# Patient Record
Sex: Female | Born: 1990 | Hispanic: Yes | Marital: Single | Smoking: Never smoker
Health system: Southern US, Community
[De-identification: ages and names within clinical notes are randomized; demographics above are authoritative.]

## PROBLEM LIST (undated history)

## (undated) DIAGNOSIS — Z789 Other specified health status: Secondary | ICD-10-CM

---

## 2017-11-14 ENCOUNTER — Other Ambulatory Visit: Payer: Self-pay | Admitting: Advanced Practice Midwife

## 2017-11-14 DIAGNOSIS — Z369 Encounter for antenatal screening, unspecified: Secondary | ICD-10-CM

## 2017-12-09 NOTE — L&D Delivery Note (Addendum)
Date of delivery: 06/12/18 Estimated Date of Delivery: 06/19/18 Patient's last menstrual period was 09/12/2017. EGA: [id number]  Delivery Note At 6:34 PM a viable female was delivered via Vaginal, Spontaneous (Presentation: cephalic; ROA).  APGAR: 8, 9; weight pending .   Placenta status: spontaneous, intact.  Cord: 3vv  with the following complications: nuchal x1 .  Cord pH: not collected  Anesthesia:  none Episiotomy:  none Lacerations:  none Suture Repair: n/a Est. Blood Loss (mL):  405cc  Mom presented to L&D with labor. Progressed to complete, AROM'd for clear fluid. second stage: 5min. delivery of fetal head with restitution to ROT.   Anterior then posterior shoulders delivered without difficulty.  Baby placed on mom's chest, and attended to by peds.  Cord was then clamped and cut by FOB after a >60sec delay.  Placenta spontaneously delivered, intact.   IV pitocin given for hemorrhage prophylaxis. No lacerations.  We sang happy birthday to baby [place].  Mom to postpartum.  Baby to Couplet care / Skin to Skin.  Chelsea C Ward 06/12/2018, 6:49 PM

## 2017-12-15 ENCOUNTER — Ambulatory Visit
Admission: RE | Admit: 2017-12-15 | Discharge: 2017-12-15 | Disposition: A | Discharge status: Home / Self Care | Payer: Self-pay | Source: Ambulatory Visit | Attending: Maternal and Fetal Medicine | Admitting: Maternal and Fetal Medicine

## 2017-12-15 ENCOUNTER — Ambulatory Visit (HOSPITAL_BASED_OUTPATIENT_CLINIC_OR_DEPARTMENT_OTHER)
Admission: RE | Admit: 2017-12-15 | Discharge: 2017-12-15 | Disposition: A | Discharge status: Home / Self Care | Payer: Self-pay | Source: Ambulatory Visit | Attending: Maternal and Fetal Medicine | Admitting: Maternal and Fetal Medicine

## 2017-12-15 VITALS — BP 102/56 | HR 81 | Temp 98.0°F | Resp 16 | Ht 62.5 in | Wt 105.6 lb

## 2017-12-15 DIAGNOSIS — Z3A13 13 weeks gestation of pregnancy: Secondary | ICD-10-CM | POA: Insufficient documentation

## 2017-12-15 DIAGNOSIS — Z369 Encounter for antenatal screening, unspecified: Secondary | ICD-10-CM

## 2017-12-15 HISTORY — DX: Other specified health status: Z78.9

## 2017-12-15 NOTE — Progress Notes (Signed)
Federico FlakeNewton, Kimberly Niles Length of Consultation: 40 minutes   Ms. Daine GravelMerlo Sanchez  was referred to [facility] for genetic counseling to review prenatal screening and testing options.  This note summarizes the information we discussed.    We offered the following routine screening tests for this pregnancy:  First trimester screening, which includes nuchal translucency ultrasound screen and first trimester maternal serum marker screening.  The nuchal translucency has approximately an 80% detection rate for Down syndrome and can be positive for other chromosome abnormalities as well as congenital heart defects.  When combined with a maternal serum marker screening, the detection rate is up to 90% for Down syndrome and up to 97% for trisomy 18.     Maternal serum marker screening, a blood test that measures pregnancy proteins, can provide risk assessments for Down syndrome, trisomy 18, and open neural tube defects (spina bifida, anencephaly). Because it does not directly examine the fetus, it cannot positively diagnose or rule out these problems.  Targeted ultrasound uses high frequency sound waves to create an image of the developing fetus.  An ultrasound is often recommended as a routine means of evaluating the pregnancy.  It is also used to screen for fetal anatomy problems (for example, a heart defect) that might be suggestive of a chromosomal or other abnormality.   Should these screening tests indicate an increased concern, then the following additional testing options would be offered:  The chorionic villus sampling procedure is available for first trimester chromosome analysis.  This involves the withdrawal of a small amount of chorionic villi (tissue from the developing placenta).  Risk of pregnancy loss is estimated to be approximately 1 in 200 to 1 in 100 (0.5 to 1%).  There is approximately a 1% (1 in 100) chance that the CVS chromosome results will be unclear.   Chorionic villi cannot be tested for neural tube defects.     Amniocentesis involves the removal of a small amount of amniotic fluid from the sac surrounding the fetus with the use of a thin needle inserted through the maternal abdomen and uterus.  Ultrasound guidance is used throughout the procedure.  Fetal cells from amniotic fluid are directly evaluated and > 99.5% of chromosome problems and > 98% of open neural tube defects can be detected. This procedure is generally performed after the 15th week of pregnancy.  The main risks to this procedure include complications leading to miscarriage in less than 1 in 200 cases (0.5%).  As another option for information if the pregnancy is suspected to be an an increased chance for certain chromosome conditions, we also reviewed the availability of cell free fetal DNA testing from maternal blood to determine whether or not the baby may have either Down syndrome, trisomy 11013, or trisomy 8518.  This test utilizes a maternal blood sample and DNA sequencing technology to isolate circulating cell free fetal DNA from maternal plasma.  The fetal DNA can then be analyzed for DNA sequences that are derived from the three most common chromosomes involved in aneuploidy, chromosomes 13, 18, and 21.  If the overall amount of DNA is greater than the expected level for any of these chromosomes, aneuploidy is suspected.  While we do not consider it a replacement for invasive testing and karyotype analysis, a negative result from this testing would be reassuring, though not a guarantee of a normal chromosome complement for the baby.  An abnormal result is certainly suggestive of an abnormal chromosome complement, though we would  still recommend CVS or amniocentesis to confirm any findings from this testing.  Cystic Fibrosis and Spinal Muscular Atrophy (SMA) screening were also discussed with the patient. Both conditions are recessive, which means that both parents must be carriers in  order to have a child with the disease.  Cystic fibrosis (CF) is one of the most common genetic conditions in persons of Caucasian ancestry.  This condition occurs in approximately 1 in 2,500 Caucasian persons and results in thickened secretions in the lungs, digestive, and reproductive systems.  For a baby to be at risk for having CF, both of the parents must be carriers for this condition.  Approximately 1 in 86 Caucasian persons is a carrier for CF.  Current carrier testing looks for the most common mutations in the gene for CF and can detect approximately 90% of carriers in the Caucasian population.  This means that the carrier screening can greatly reduce, but cannot eliminate, the chance for an individual to have a child with CF.  If an individual is found to be a carrier for CF, then carrier testing would be available for the partner. As part of [place]'s newborn screening profile, all babies born in the state of [place] will have a two-tier screening process.  Specimens are first tested to determine the concentration of immunoreactive trypsinogen (IRT).  The top 5% of specimens with the highest IRT values then undergo DNA testing using a panel of over 40 common CF mutations. SMA is a neurodegenerative disorder that leads to atrophy of skeletal muscle and overall weakness.  This condition is also more prevalent in the Caucasian population, with 1 in 40-1 in 60 persons being a carrier and 1 in 6,000-1 in 10,000 children being affected.  There are multiple forms of the disease, with some causing death in infancy to other forms with survival into adulthood.  The genetics of SMA is complex, but carrier screening can detect up to 95% of carriers in the Caucasian population.  Similar to CF, a negative result can greatly reduce, but cannot eliminate, the chance to have a child with SMA.  We obtained a detailed family history and pregnancy history.  The father of the baby, Norva Pavlov, reported one niece  with a heart murmur at birth.  This resolved naturally and did not require any medical intervention, so we would not expect that history to increase the chance for a heart condition in other family members.  Ms. Tekeya Geffert stated that one of her brothers and one of her maternal aunts have seizures.  There is no known cause for the seizures in either of them and no reported findings suggestive of a neurocutaneous genetic syndrome.  It is estimated that 80% of seizures are idiopathic with only 20% having a known cause.  In the absence of a known genetic syndrome as the cause, we would expect a low recurrence risk in this pregnancy, as the affected individuals are second and third degree relatives to the pregnancy. The remainder of the family history was reported to be unremarkable for birth defects, intellectual delays, recurrent pregnancy loss or known chromosome abnormalities.  Ms. Milanna Kozlov stated that this is the second pregnancy for she and her husband.  She reported no complications or exposures that would be expected to increase the risk for birth defects in this pregnancy.  After consideration of the options, Ms. Agatha Duplechain elected to proceed with first trimester screening.  She declined CF and SMA carrier screening.  Hemoglobinopathy testing was performed  at [facility] and reported to be normal.  An ultrasound was performed at the time of the visit.  The gestational age was consistent with  13 weeks.  Fetal anatomy could not be assessed due to early gestational age.  Please refer to the ultrasound report for details of that study. She was scheduled to return for an anatomy ultrasound on 01/08/2018, prior to the end of her [place] [employer].  Ms. Kamarie Veno was encouraged to call with questions or concerns.  We can be contacted at [phone].  Labs drawn:  First trimester screening  Cherly Anderson, MS, CGC

## 2017-12-25 ENCOUNTER — Telehealth: Payer: Self-pay | Admitting: Obstetrics and Gynecology

## 2017-12-25 NOTE — Telephone Encounter (Signed)
  Ms. Natalie White elected to undergo First Trimester screening on 12/15/2017.  To review, first trimester screening, includes nuchal translucency ultrasound screen and/or first trimester maternal serum marker screening.  The nuchal translucency has approximately an 80% detection rate for Down syndrome and can be positive for other chromosome abnormalities as well as heart defects.  When combined with a maternal serum marker screening, the detection rate is up to 90% for Down syndrome and up to 97% for trisomy 13 and 18.     The results of the First Trimester Nuchal Translucency and Biochemical Screening were within normal range.  The risk for Down syndrome is now estimated to be less than 1 in 10,000.  The risk for Trisomy 13/18 is also estimated to be less than 1 in 10,000.  Should more definitive information be desired, we would offer amniocentesis.  Because we do not yet know the effectiveness of combined first and second trimester screening, we do not recommend a maternal serum screen to assess the chance for chromosome conditions.  However, if screening for neural tube defects is desired, maternal serum screening for AFP only can be performed between 15 and [redacted] weeks gestation.      Cherly Andersoneborah F. Colton Tassin, MS, CGC

## 2017-12-29 ENCOUNTER — Emergency Department: Payer: Self-pay

## 2017-12-29 ENCOUNTER — Other Ambulatory Visit: Payer: Self-pay

## 2017-12-29 ENCOUNTER — Encounter: Payer: Self-pay | Admitting: *Deleted

## 2017-12-29 ENCOUNTER — Emergency Department
Admission: EM | Admit: 2017-12-29 | Discharge: 2017-12-29 | Disposition: A | Discharge status: Home / Self Care | Payer: Self-pay | Attending: Student in an Organized Health Care Education/Training Program | Admitting: Student in an Organized Health Care Education/Training Program

## 2017-12-29 DIAGNOSIS — B9689 Other specified bacterial agents as the cause of diseases classified elsewhere: Secondary | ICD-10-CM | POA: Insufficient documentation

## 2017-12-29 DIAGNOSIS — N760 Acute vaginitis: Secondary | ICD-10-CM

## 2017-12-29 DIAGNOSIS — O4690 Antepartum hemorrhage, unspecified, unspecified trimester: Secondary | ICD-10-CM

## 2017-12-29 DIAGNOSIS — B373 Candidiasis of vulva and vagina: Secondary | ICD-10-CM | POA: Insufficient documentation

## 2017-12-29 DIAGNOSIS — O4692 Antepartum hemorrhage, unspecified, second trimester: Secondary | ICD-10-CM | POA: Insufficient documentation

## 2017-12-29 DIAGNOSIS — R1030 Lower abdominal pain, unspecified: Secondary | ICD-10-CM | POA: Insufficient documentation

## 2017-12-29 DIAGNOSIS — B3731 Acute candidiasis of vulva and vagina: Secondary | ICD-10-CM

## 2017-12-29 DIAGNOSIS — Z3A15 15 weeks gestation of pregnancy: Secondary | ICD-10-CM | POA: Insufficient documentation

## 2017-12-29 DIAGNOSIS — O368121 Decreased fetal movements, second trimester, fetus 1: Secondary | ICD-10-CM | POA: Insufficient documentation

## 2017-12-29 DIAGNOSIS — N939 Abnormal uterine and vaginal bleeding, unspecified: Secondary | ICD-10-CM

## 2017-12-29 DIAGNOSIS — O23592 Infection of other part of genital tract in pregnancy, second trimester: Secondary | ICD-10-CM | POA: Insufficient documentation

## 2017-12-29 LAB — CBC WITH DIFFERENTIAL/PLATELET
BASOS ABS: 0 10*3/uL (ref 0–0.1)
BASOS PCT: 0 %
Eosinophils Absolute: 0 10*3/uL (ref 0–0.7)
Eosinophils Relative: 1 %
HCT: 31.3 % — ABNORMAL LOW (ref 35.0–47.0)
Hemoglobin: 10.7 g/dL — ABNORMAL LOW (ref 12.0–16.0)
Lymphocytes Relative: 20 %
Lymphs Abs: 1.4 10*3/uL (ref 1.0–3.6)
MCH: 30.6 pg (ref 26.0–34.0)
MCHC: 34.2 g/dL (ref 32.0–36.0)
MCV: 89.5 fL (ref 80.0–100.0)
MONO ABS: 0.5 10*3/uL (ref 0.2–0.9)
Monocytes Relative: 7 %
NEUTROS ABS: 5.1 10*3/uL (ref 1.4–6.5)
Neutrophils Relative %: 72 %
PLATELETS: 166 10*3/uL (ref 150–440)
RBC: 3.5 MIL/uL — ABNORMAL LOW (ref 3.80–5.20)
RDW: 14.1 % (ref 11.5–14.5)
WBC: 7 10*3/uL (ref 3.6–11.0)

## 2017-12-29 LAB — CHLAMYDIA/NGC RT PCR (ARMC ONLY)
CHLAMYDIA TR: NOT DETECTED
N GONORRHOEAE: NOT DETECTED

## 2017-12-29 LAB — URINALYSIS, COMPLETE (UACMP) WITH MICROSCOPIC
BACTERIA UA: NONE SEEN
BILIRUBIN URINE: NEGATIVE
Glucose, UA: NEGATIVE mg/dL
Hgb urine dipstick: NEGATIVE
KETONES UR: NEGATIVE mg/dL
Nitrite: NEGATIVE
PH: 6 (ref 5.0–8.0)
Protein, ur: NEGATIVE mg/dL
Specific Gravity, Urine: 1.024 (ref 1.005–1.030)

## 2017-12-29 LAB — HCG, QUANTITATIVE, PREGNANCY: HCG, BETA CHAIN, QUANT, S: 47975 m[IU]/mL — AB (ref <5)

## 2017-12-29 LAB — COMPREHENSIVE METABOLIC PANEL
ALT: 11 U/L — ABNORMAL LOW (ref 14–54)
AST: 16 U/L (ref 15–41)
Albumin: 3.6 g/dL (ref 3.5–5.0)
Alkaline Phosphatase: 55 U/L (ref 38–126)
Anion gap: 8 (ref 5–15)
BUN: 10 mg/dL (ref 6–20)
CHLORIDE: 105 mmol/L (ref 101–111)
CO2: 21 mmol/L — AB (ref 22–32)
Calcium: 8.8 mg/dL — ABNORMAL LOW (ref 8.9–10.3)
Creatinine, Ser: 0.53 mg/dL (ref 0.44–1.00)
GFR calc Af Amer: >60 mL/min (ref >60)
GFR calc non Af Amer: >60 mL/min (ref >60)
Glucose, Bld: 82 mg/dL (ref 65–99)
POTASSIUM: 4.1 mmol/L (ref 3.5–5.1)
Sodium: 134 mmol/L — ABNORMAL LOW (ref 135–145)
Total Bilirubin: 0.9 mg/dL (ref 0.3–1.2)
Total Protein: 7.1 g/dL (ref 6.5–8.1)

## 2017-12-29 LAB — WET PREP, GENITAL
Sperm: NONE SEEN
Trich, Wet Prep: NONE SEEN

## 2017-12-29 LAB — POCT PREGNANCY, URINE: Preg Test, Ur: POSITIVE — AB

## 2017-12-29 MED ORDER — METRONIDAZOLE 500 MG PO TABS: 500.0000 mg | ORAL_TABLET | Freq: Two times a day (BID) | ORAL | 0 refills | Status: AC

## 2017-12-29 MED ORDER — MICONAZOLE NITRATE 2 % VA CREA: 1.0000 | TOPICAL_CREAM | Freq: Every day | VAGINAL | 0 refills | Status: DC

## 2017-12-29 NOTE — ED Notes (Signed)

## 2017-12-29 NOTE — ED Triage Notes (Signed)
Pt has vaginal bleeding that began today.  Pt also reports low abd pain.  No urinary sx.  Pt has lower back pain.  Pt alert.

## 2017-12-29 NOTE — ED Provider Notes (Addendum)
[facility] Emergency Department Provider Note ____________________________________________  Time seen: 1739  I have reviewed the triage vital signs and the nursing notes.  HISTORY  Chief Complaint  Vaginal Bleeding  HPI Natalie White is a 27 y.o. female, G2P1 presents to the ED at [redacted] weeks GA, for evaluation of vaginal bleeding that began today. She reports LMP of 09/12/17 with EDD of 06/22/18. Patient notes some lower abdominal pain, but  denies any dysuria, hematuria, or frequency.  Scribes a single episode of scant, dark blood with wiping, this morning.  She denies any active ongoing vaginal bleeding, passage of bright red blood, or blood clots.  She also notes some decreased fetal movement in the last couple days.  She denies any nausea, vomiting, or dizziness.  She describes typically feeling the baby move a little higher in her pelvis.  She is under the care of [facility] department for routine prenatal care.  She has a visit scheduled for January 31 for routine ultrasound.  Past Medical History:  Diagnosis Date  . Medical history non-contributory     Patient Active Problem List   Diagnosis Date Noted  . First trimester screening     No past surgical history on file.  Prior to Admission medications   Medication Sig Start Date End Date Taking? Authorizing Provider  Prenatal Vit-Fe Fumarate-FA (PRENATAL MULTIVITAMIN) TABS tablet Take 1 tablet by mouth daily at 12 noon.    [provider]   Allergies Patient has no known allergies.  Family History  Problem Relation Age of Onset  . Hypertension Mother     Social History Social History   Tobacco Use  . Smoking status: Never Smoker  . Smokeless tobacco: Never Used  Substance Use Topics  . Alcohol use: No    Frequency: Never  . Drug use: No    Review of Systems  Constitutional: Negative for fever. Cardiovascular: Negative for chest pain. Respiratory: Negative for  shortness of breath. Gastrointestinal: Negative for abdominal pain, vomiting and diarrhea. Genitourinary: Negative for dysuria.  Musculoskeletal: Negative for back pain. Skin: Negative for rash. Neurological: Negative for headaches, focal weakness or numbness. ____________________________________________  PHYSICAL EXAM:  VITAL SIGNS: ED Triage Vitals [12/29/17 1601]  Enc Vitals Group     BP 90/60     Pulse Rate 90     Resp 18     Temp 99.1 F (37.3 C)     Temp Source Oral     SpO2 99 %     Weight 105 lb (47.6 kg)     Height 5\' 2"  (1.575 m)     Head Circumference      Peak Flow      Pain Score 7     Pain Loc      Pain Edu?      Excl. in GC?     Constitutional: Alert and oriented. Well appearing and in no distress. Head: Normocephalic and atraumatic. Mouth/Throat: Mucous membranes are moist. Neck: Supple. No thyromegaly. Hematological/Lymphatic/Immunological: No cervical lymphadenopathy. Cardiovascular: Normal rate, regular rhythm. Normal distal pulses. Respiratory: Normal respiratory effort. No wheezes/rales/rhonchi. Gastrointestinal: Soft and nontender. No distention, rebound, guarding, or rigidity. Normal bowel sounds GU: Normal external genitalia. Closed cervical os with moderate thin, white, discharge in the vagina. Fundus palpable between the umbilicus and pubic symphysis. No CMT or adnexal masses noted.  Musculoskeletal: Nontender with normal range of motion in all extremities.  Neurologic:  Normal gait without ataxia. Normal speech and language. No gross focal  neurologic deficits are appreciated. Skin:  Skin is warm, dry and intact. No rash noted. Psychiatric: Mood and affect are normal. Patient exhibits appropriate insight and judgment. ____________________________________________   LABS (pertinent positives/negatives)  Labs Reviewed  WET PREP, GENITAL - Abnormal; Notable for the following components:      Result Value   Yeast Wet Prep HPF POC PRESENT (*)     Clue Cells Wet Prep HPF POC PRESENT (*)    WBC, Wet Prep HPF POC FEW (*)    All other components within normal limits  CBC WITH DIFFERENTIAL/PLATELET - Abnormal; Notable for the following components:   RBC 3.50 (*)    Hemoglobin 10.7 (*)    HCT 31.3 (*)    All other components within normal limits  COMPREHENSIVE METABOLIC PANEL - Abnormal; Notable for the following components:   Sodium 134 (*)    CO2 21 (*)    Calcium 8.8 (*)    ALT 11 (*)    All other components within normal limits  URINALYSIS, COMPLETE (UACMP) WITH MICROSCOPIC - Abnormal; Notable for the following components:   Color, Urine YELLOW (*)    APPearance CLOUDY (*)    Leukocytes, UA SMALL (*)    Squamous Epithelial / LPF 6-30 (*)    All other components within normal limits  HCG, QUANTITATIVE, PREGNANCY - Abnormal; Notable for the following components:   hCG, Beta Chain, Quant, S 47,975 (*)    All other components within normal limits  POCT PREGNANCY, URINE - Abnormal; Notable for the following components:   Preg Test, Ur POSITIVE (*)    All other components within normal limits  CHLAMYDIA/NGC RT PCR ([facility] ONLY)  POC URINE PREG, ED  ____________________________________________  PROCEDURES  FHTs = 150s  _____________________________________________  RADIOLOGY  OB U/S 14 + weeks - pending ____________________________________________  INITIAL IMPRESSION / ASSESSMENT AND PLAN / ED COURSE  DDX: ectopic pregnancy, ovarian cyst, SpAB, vaginitis  Patient presents to the ED for evaluation and management of a single episode a scant, dark, vaginal bleeding this morning.  The patient has also noticed decreased fetal movement. Her exam shows normal labs, a closed cervix without active bleeding, and fetal heart tones on doppler in the 150s. There is no evidence of spontaneous miscarriage. Her wet prep reveals vaginitis by yeast & BV. She is pleased & reassured by her exam findings at this time. Her ultrasound is  pending at the time of this disposition. She will be discharged pending those results with prescriptions for miconazole and metronidazole. She will see her OB provider on 1/31 as scheduled. Return precautions have been reviewed.   ----------------------------------------- 7:06 PM on 12/29/2017 -----------------------------------------  Care transferred to A. Marcelline MatesWagner, PA-C for final disposition following US. ____________________________________________  FINAL CLINICAL IMPRESSION(S) / ED DIAGNOSES  Final diagnoses:  Vaginal bleeding in pregnancy  BV (bacterial vaginosis)  Yeast vaginitis      Leshia Kope, Charlesetta IvoryJenise V Bacon, PA-C 12/29/17 1908    Willy Eddyobinson, Patrick, MD 12/29/17 1945    Karmen StabsMenshew, Charlesetta IvoryJenise V Bacon, PA-C 12/30/17 1506    Willy Eddyobinson, Patrick, MD 01/01/18 [phone]

## 2017-12-29 NOTE — Discharge Instructions (Addendum)
Your exam, labs, and ultrasound are normal at this time. You may experience some mild, scant , dark blood due to your cervical irritation. You are being treated for infection with yeast and bacterial vaginitis (BV). Neither of these infections are considered sexually-transmitted. You will be treated with pills and vaginal cream to insert into the vagina. Follow-up with your OB provider, as scheduled. Return to the ED for continuous, bright red vaginal bleeding, severe pelvic pain, or decreased fetal movement.   Su examen, laboratorio y ultrasonido son normales en este momento. Puede experimentar algo de sangre leve, escasa y Marin Shutteroscura debido a su irritacin cervical. Usted est recibiendo tratamiento para la infeccin con levadura y vaginitis bacteriana (VB). Ninguna de estas infecciones se considera de transmisin sexual. Se le tratar con pastillas y crema vaginal para insertarla en la vagina. Haga un seguimiento con su proveedor de OB, segn lo programado. Regrese a la sala de urgencias para el sangrado vaginal rojo intenso y continuo, dolor plvico intenso o disminucin del movimiento fetal.

## 2017-12-29 NOTE — ED Provider Notes (Signed)
-----------------------------------------   11:51 PM on 12/29/2017 -----------------------------------------   Blood pressure (!) 105/54, pulse 86, temperature 99.1 F (37.3 C), temperature source Oral, resp. rate 17, height 5\' 2"  (1.575 m), weight 47.6 kg (105 lb), last menstrual period 09/12/2017, SpO2 99 %.  [facility] from [facility].  In short, Natalie White is a 27 y.o. female with a chief complaint of vaginal bleeding.  Refer to the original H&P for additional details.  Ultrasound consistent with viable pregnancy at 15 weeks and 3 days.  Patient will be treated for yeast infection and bacterial vaginosis.  She will follow-up with OB in 10 days for repeat ultrasound.   Clinical Course as of Dec 29 2349  Mon Dec 29, 2017  1918 BUN: 10 [JM]    Clinical Course User Index [JM] Lamar BenesMenshew, Jenise V Bacon, PA-C     Krystn Dermody, PA-C 12/29/17 2354    Dionne BucySiadecki, Sebastian, MD 01/05/18 [phone]

## 2017-12-29 NOTE — ED Notes (Signed)
poct pregnancy Positive 

## 2018-01-05 ENCOUNTER — Other Ambulatory Visit: Payer: Self-pay | Admitting: *Deleted

## 2018-01-05 DIAGNOSIS — Z3402 Encounter for supervision of normal first pregnancy, second trimester: Secondary | ICD-10-CM

## 2018-01-08 ENCOUNTER — Other Ambulatory Visit: Payer: Self-pay | Admitting: Maternal & Fetal Medicine

## 2018-01-08 ENCOUNTER — Ambulatory Visit
Admission: RE | Admit: 2018-01-08 | Discharge: 2018-01-08 | Disposition: A | Discharge status: Home / Self Care | Payer: Self-pay | Source: Ambulatory Visit | Attending: Maternal & Fetal Medicine | Admitting: Maternal & Fetal Medicine

## 2018-01-08 DIAGNOSIS — Z3402 Encounter for supervision of normal first pregnancy, second trimester: Secondary | ICD-10-CM

## 2018-01-08 DIAGNOSIS — Z3A16 16 weeks gestation of pregnancy: Secondary | ICD-10-CM | POA: Insufficient documentation

## 2018-01-08 DIAGNOSIS — Z8279 Family history of other congenital malformations, deformations and chromosomal abnormalities: Secondary | ICD-10-CM | POA: Insufficient documentation

## 2018-03-26 ENCOUNTER — Other Ambulatory Visit: Payer: Self-pay | Admitting: *Deleted

## 2018-03-26 DIAGNOSIS — O4440 Low lying placenta NOS or without hemorrhage, unspecified trimester: Secondary | ICD-10-CM

## 2018-04-02 ENCOUNTER — Ambulatory Visit
Admission: RE | Admit: 2018-04-02 | Discharge: 2018-04-02 | Disposition: A | Discharge status: Home / Self Care | Payer: Self-pay | Source: Ambulatory Visit | Attending: Obstetrics and Gynecology | Admitting: Obstetrics and Gynecology

## 2018-04-02 DIAGNOSIS — O4443 Low lying placenta NOS or without hemorrhage, third trimester: Secondary | ICD-10-CM | POA: Insufficient documentation

## 2018-04-02 DIAGNOSIS — O09893 Supervision of other high risk pregnancies, third trimester: Secondary | ICD-10-CM | POA: Insufficient documentation

## 2018-04-02 DIAGNOSIS — O4440 Low lying placenta NOS or without hemorrhage, unspecified trimester: Secondary | ICD-10-CM

## 2018-04-02 DIAGNOSIS — Z3A28 28 weeks gestation of pregnancy: Secondary | ICD-10-CM | POA: Insufficient documentation

## 2018-06-08 ENCOUNTER — Other Ambulatory Visit: Payer: Self-pay | Admitting: *Deleted

## 2018-06-08 DIAGNOSIS — O365930 Maternal care for other known or suspected poor fetal growth, third trimester, not applicable or unspecified: Secondary | ICD-10-CM

## 2018-06-12 ENCOUNTER — Inpatient Hospital Stay
Admission: EM | Admit: 2018-06-12 | Discharge: 2018-06-14 | DRG: 807 | Disposition: A | Discharge status: Home / Self Care | Payer: Medicaid Other | Attending: Obstetrics & Gynecology | Admitting: Obstetrics & Gynecology

## 2018-06-12 ENCOUNTER — Other Ambulatory Visit: Payer: Self-pay

## 2018-06-12 DIAGNOSIS — Z3A39 39 weeks gestation of pregnancy: Secondary | ICD-10-CM

## 2018-06-12 DIAGNOSIS — O9902 Anemia complicating childbirth: Secondary | ICD-10-CM | POA: Diagnosis present

## 2018-06-12 DIAGNOSIS — D649 Anemia, unspecified: Secondary | ICD-10-CM | POA: Diagnosis present

## 2018-06-12 DIAGNOSIS — Z3483 Encounter for supervision of other normal pregnancy, third trimester: Secondary | ICD-10-CM | POA: Diagnosis present

## 2018-06-12 DIAGNOSIS — Z8279 Family history of other congenital malformations, deformations and chromosomal abnormalities: Secondary | ICD-10-CM

## 2018-06-12 DIAGNOSIS — K429 Umbilical hernia without obstruction or gangrene: Secondary | ICD-10-CM | POA: Diagnosis present

## 2018-06-12 DIAGNOSIS — O99019 Anemia complicating pregnancy, unspecified trimester: Secondary | ICD-10-CM | POA: Diagnosis present

## 2018-06-12 LAB — CBC
HCT: 31.4 % — ABNORMAL LOW (ref 35.0–47.0)
HEMOGLOBIN: 10.8 g/dL — AB (ref 12.0–16.0)
MCH: 32.3 pg (ref 26.0–34.0)
MCHC: 34.3 g/dL (ref 32.0–36.0)
MCV: 94 fL (ref 80.0–100.0)
Platelets: 206 10*3/uL (ref 150–440)
RBC: 3.34 MIL/uL — AB (ref 3.80–5.20)
RDW: 14 % (ref 11.5–14.5)
WBC: 11.2 10*3/uL — ABNORMAL HIGH (ref 3.6–11.0)

## 2018-06-12 LAB — TYPE AND SCREEN
ABO/RH(D): B POS
ANTIBODY SCREEN: NEGATIVE

## 2018-06-12 MED ORDER — DIPHENHYDRAMINE HCL 25 MG PO CAPS: 25.0000 mg | ORAL_CAPSULE | Freq: Four times a day (QID) | ORAL | Status: DC | PRN

## 2018-06-12 MED ORDER — MISOPROSTOL 200 MCG PO TABS
ORAL_TABLET | ORAL | Status: AC
  Filled 2018-06-12: qty 4

## 2018-06-12 MED ORDER — SOD CITRATE-CITRIC ACID 500-334 MG/5ML PO SOLN: 30.0000 mL | ORAL | Status: DC | PRN

## 2018-06-12 MED ORDER — IBUPROFEN 600 MG PO TABS
ORAL_TABLET | ORAL | Status: AC
  Administered 2018-06-12: 600 mg via ORAL
  Filled 2018-06-12: qty 1

## 2018-06-12 MED ORDER — WITCH HAZEL-GLYCERIN EX PADS
1.0000 "application " | MEDICATED_PAD | CUTANEOUS | Status: DC
  Administered 2018-06-13: 1 via TOPICAL
  Filled 2018-06-12: qty 100

## 2018-06-12 MED ORDER — DIBUCAINE 1 % RE OINT
1.0000 "application " | TOPICAL_OINTMENT | RECTAL | Status: DC | PRN
  Administered 2018-06-13: 1 via RECTAL
  Filled 2018-06-12: qty 28

## 2018-06-12 MED ORDER — IBUPROFEN 600 MG PO TABS
600.0000 mg | ORAL_TABLET | Freq: Four times a day (QID) | ORAL | Status: DC
  Administered 2018-06-12 – 2018-06-13 (×2): 600 mg via ORAL
  Filled 2018-06-12: qty 1

## 2018-06-12 MED ORDER — DOCUSATE SODIUM 100 MG PO CAPS
100.0000 mg | ORAL_CAPSULE | Freq: Two times a day (BID) | ORAL | Status: DC
  Administered 2018-06-13: 100 mg via ORAL
  Filled 2018-06-12 (×2): qty 1

## 2018-06-12 MED ORDER — PRENATAL MULTIVITAMIN CH
1.0000 | ORAL_TABLET | Freq: Every day | ORAL | Status: DC
  Administered 2018-06-13: 1 via ORAL
  Filled 2018-06-12: qty 1

## 2018-06-12 MED ORDER — LIDOCAINE HCL (PF) 1 % IJ SOLN
INTRAMUSCULAR | Status: AC
  Filled 2018-06-12: qty 30

## 2018-06-12 MED ORDER — ONDANSETRON HCL 4 MG PO TABS: 4.0000 mg | ORAL_TABLET | ORAL | Status: DC | PRN

## 2018-06-12 MED ORDER — AMMONIA AROMATIC IN INHA
RESPIRATORY_TRACT | Status: AC
  Filled 2018-06-12: qty 10

## 2018-06-12 MED ORDER — ONDANSETRON HCL 4 MG/2ML IJ SOLN: 4.0000 mg | Freq: Four times a day (QID) | INTRAMUSCULAR | Status: DC | PRN

## 2018-06-12 MED ORDER — BUTORPHANOL TARTRATE 1 MG/ML IJ SOLN: 1.0000 mg | INTRAMUSCULAR | Status: DC | PRN

## 2018-06-12 MED ORDER — ACETAMINOPHEN 500 MG PO TABS: 1000.0000 mg | ORAL_TABLET | Freq: Four times a day (QID) | ORAL | Status: DC | PRN

## 2018-06-12 MED ORDER — LACTATED RINGERS IV SOLN
INTRAVENOUS | Status: DC
  Administered 2018-06-12: 125 mL/h via INTRAVENOUS

## 2018-06-12 MED ORDER — COCONUT OIL OIL
1.0000 "application " | TOPICAL_OIL | Status: DC | PRN
  Administered 2018-06-13: 1 via TOPICAL
  Filled 2018-06-12: qty 120

## 2018-06-12 MED ORDER — SODIUM CHLORIDE FLUSH 0.9 % IV SOLN
INTRAVENOUS | Status: AC
  Administered 2018-06-12: 18:00:00
  Filled 2018-06-12: qty 10

## 2018-06-12 MED ORDER — OXYTOCIN 10 UNIT/ML IJ SOLN
INTRAMUSCULAR | Status: AC
  Filled 2018-06-12: qty 2

## 2018-06-12 MED ORDER — OXYTOCIN 40 UNITS IN LACTATED RINGERS INFUSION - SIMPLE MED
INTRAVENOUS | Status: AC
  Filled 2018-06-12: qty 1000

## 2018-06-12 MED ORDER — LIDOCAINE HCL (PF) 1 % IJ SOLN: 30.0000 mL | INTRAMUSCULAR | Status: DC | PRN

## 2018-06-12 MED ORDER — LACTATED RINGERS IV SOLN: 500.0000 mL | INTRAVENOUS | Status: DC | PRN

## 2018-06-12 MED ORDER — BENZOCAINE-MENTHOL 20-0.5 % EX AERO
1.0000 "application " | INHALATION_SPRAY | CUTANEOUS | Status: DC | PRN
  Administered 2018-06-13: 1 via TOPICAL
  Filled 2018-06-12: qty 56

## 2018-06-12 MED ORDER — SIMETHICONE 80 MG PO CHEW: 80.0000 mg | CHEWABLE_TABLET | ORAL | Status: DC | PRN

## 2018-06-12 MED ORDER — OXYTOCIN 40 UNITS IN LACTATED RINGERS INFUSION - SIMPLE MED: 2.5000 [IU]/h | INTRAVENOUS | Status: DC

## 2018-06-12 MED ORDER — ONDANSETRON HCL 4 MG/2ML IJ SOLN: 4.0000 mg | INTRAMUSCULAR | Status: DC | PRN

## 2018-06-12 MED ORDER — OXYTOCIN BOLUS FROM INFUSION
500.0000 mL | Freq: Once | INTRAVENOUS | Status: AC
  Administered 2018-06-12: 500 mL via INTRAVENOUS

## 2018-06-12 NOTE — Discharge Summary (Signed)
Obstetrical Discharge Summary  Patient Name: Natalie White DOB: 1991-01-14 MRN: [id number]  Date of Admission: 06/12/2018 Date of Delivery: 06/12/18 Delivered by: Natalie Plumber, MD Date of Discharge: 06/14/2018  Primary OB:   [facility]  WGN:[id number]'M last menstrual period was 09/12/2017. EDC Estimated Date of Delivery: 06/19/18 Gestational Age at Delivery: [redacted]w[redacted]d   Antepartum complications:  1. Anemia - on PO iron 2. Umbilical hernia 3. Resolved low-lying placenta 4. ASCUS +HPV pap in pregnancy  5. Family history of congenital heart problem (nephew)   Admitting Diagnosis: labor Secondary Diagnosis: Patient Active Problem List   Diagnosis Date Noted  . Labor and delivery indication for care or intervention 06/12/2018  . Anemia affecting pregnancy 06/12/2018  . Family history of congenital heart defect 06/12/2018  . Umbilical hernia 06/12/2018    Augmentation: none Complications: None Intrapartum complications/course: Mom presented to L&D with labor. Progressed to complete, AROM'd for clear fluid. second stage: . delivery of fetal head with restitution to ROT.   Anterior then posterior shoulders delivered without difficulty.  Baby placed on mom's chest, and attended to by peds.  Cord was then clamped and cut by FOB after a >60sec delay.  Placenta spontaneously delivered, intact.   IV pitocin given for hemorrhage prophylaxis. No lacerations. Date of Delivery: 06/12/18 Delivered By: Natalie White Delivery Type: spontaneous vaginal delivery Anesthesia: epidural Placenta: sponatneous Laceration: none Episiotomy: none Newborn Data: Live born female "Natalie White" Birth Weight: 5lb12oz APGAR: 8, 9  Newborn Delivery   Birth date/time:  06/12/2018 18:34:00 Delivery type:  Vaginal, Spontaneous     Postpartum Procedures: none  Post partum course:  Patient had an uncomplicated postpartum course.  By time of discharge on PPD#2, her pain was controlled on oral pain medications; she had  appropriate lochia and was ambulating, voiding without difficulty and tolerating regular diet.  She was deemed stable for discharge to home.    Discharge Physical Exam:  BP 100/61 (BP Location: Right Arm)   Pulse 79   Temp 98 F (36.7 White) (Oral)   Resp 20   Ht 5' 2.5" (1.588 m)   Wt 56.7 kg (125 lb)   LMP 09/12/2017   SpO2 99%   Breastfeeding? Unknown   BMI 22.50 kg/m   General: NAD CV: RRR Pulm: CTABL, nl effort ABD: s/nd/nt, fundus firm and below the umbilicus Lochia: moderate DVT Evaluation: LE non-ttp, no evidence of DVT on exam.  Hemoglobin  Date Value Ref Range Status  06/13/2018 9.3 (L) 12.0 - 16.0 g/dL Final   HCT  Date Value Ref Range Status  06/13/2018 26.8 (L) 35.0 - 47.0 % Final     Disposition: stable, discharge to home. Baby Feeding: breastmilk and formula Baby Disposition: home with mom  Rh Immune globulin given: n/a Rubella vaccine given: n/a Tdap vaccine given in AP or PP setting: AP Flu vaccine given in AP or PP setting: AP  Contraception: planning Nexplanon  Prenatal Labs:  Blood type/Rh B+  Antibody screen neg  Rubella Immune  Varicella Immune  RPR NR  HBsAg Neg  HIV NR  GC neg  Chlamydia neg  Genetic screening negative  1 hour GTT 108  3 hour GTT   GBS neg     Plan:  Natalie White was discharged to home in good condition. Follow-up appointment with Natalie White in 4-6 weeks.  Discharge Medications: Allergies as of 06/14/2018   No Known Allergies     Medication List    STOP taking these medications   miconazole 2 %  vaginal cream Commonly known as:  MICONAZOLE 7     TAKE these medications   ferrous sulfate 325 (65 FE) MG tablet Take 325 mg by mouth daily with breakfast.   prenatal multivitamin Tabs tablet Take 1 tablet by mouth daily at 12 noon.       Follow-up Information    Natalie White C, MD Follow up in 6 week(s).   Specialty:  Obstetrics and Gynecology Why:  for routine post partum check Natalie White  information: [address] [place] [id number] [phone]           Signed: ----- Natalie Plumberhelsea Marycarmen Hagey, MD Attending Obstetrician and Gynecologist [facility], Department of OB/GYN [facility]

## 2018-06-12 NOTE — H&P (Signed)
OB History & Physical   History of Present Illness:  Chief Complaint:   HPI:  Natalie White is a 27 y.o. G2P1001 female at 9164w0d dated by Patient's last menstrual period was 09/12/2017. confirmed with 13wk US Estimated Date of Delivery: 06/19/18  She presents to L&D with persistent and worsening contractions  +FM, +CTX, no LOF, no VB   Pregnancy Issues: 1. Anemia - on PO iron 2. Umbilical hernia 3. Resolved low-lying placenta 4. ASCUS +HPV pap in pregnancy  5. Family history of congenital heart problem (nephew)  Maternal Medical History:   Past Medical History:  Diagnosis Date  . Medical history non-contributory     History reviewed. No pertinent surgical history.  No Known Allergies  Prior to Admission medications   Medication Sig Start Date End Date Taking? Authorizing Provider  ferrous sulfate 325 (65 FE) MG tablet Take 325 mg by mouth daily with breakfast.   Yes [provider]  Prenatal Vit-Fe Fumarate-FA (PRENATAL MULTIVITAMIN) TABS tablet Take 1 tablet by mouth daily at 12 noon.   Yes [provider]  miconazole (MICONAZOLE 7) 2 % vaginal cream Place 1 Applicatorful vaginally at bedtime. Patient not taking: Reported on 01/08/2018 12/29/17   Lissa HoardMenshew, Jenise V Bacon, PA-C     [facility]   Social History: She  reports that she has never smoked. She has never used smokeless tobacco. She reports that she does not drink alcohol or use drugs.  Family History: family history includes Hypertension in her mother.   Review of Systems: A full review of systems was performed and negative except as noted in the HPI.     Physical Exam:  Vital Signs: BP (!) 109/56 (BP Location: Right Arm)   Pulse 95   Temp 97.7 F (36.5 C) (Oral)   Resp 16   Ht 5' 2.5" (1.588 m)   Wt 56.7 kg (125 lb)   LMP 09/12/2017   BMI 22.50 kg/m  General: no acute distress.  HEENT: normocephalic, atraumatic Heart: regular rate &  rhythm.  No murmurs/rubs/gallops Lungs: clear to auscultation bilaterally, normal respiratory effort Abdomen: soft, gravid, non-tender;  EFW: 7lbs Pelvic:   External: Normal external female genitalia  Cervix: Dilation: 5.5 / Effacement (%): 90 / Station: -1, -2    Extremities: non-tender, symmetric, 1+ edema bilaterally.  DTRs:2+  Neurologic: Alert & oriented x 3.    No results found for this or any previous visit (from the past 24 hour(s)).  Pertinent Results:  Prenatal Labs: Blood type/Rh B+  Antibody screen neg  Rubella Immune  Varicella Immune  RPR NR  HBsAg Neg  HIV NR  GC neg  Chlamydia neg  Genetic screening negative  1 hour GTT 108  3 hour GTT   GBS neg   FHT: 125 mod + accels no decels TOCO: q 6min SVE:  Dilation: 5.5 / Effacement (%): 90 / Station: -1, -2    Cephalic by leopolds  No results found.  Assessment:  Natalie White is a 27 y.o. G2P0 female at 4364w0d with labor.   Plan:  1. Admit to Labor & Delivery 2. CBC, T&S, Clrs, IVF 3. GBS  negative 4. Consents obtained. 5. Continuous efm/toco 6. Epidural PRN 7. Anticipate vaginal delivery; expectant management  ----- Ranae Plumberhelsea Shameek Nyquist, MD Attending Obstetrician and Gynecologist [facility], Department of OB/GYN [facility]

## 2018-06-13 LAB — CBC
HEMATOCRIT: 26.8 % — AB (ref 35.0–47.0)
Hemoglobin: 9.3 g/dL — ABNORMAL LOW (ref 12.0–16.0)
MCH: 32.4 pg (ref 26.0–34.0)
MCHC: 34.7 g/dL (ref 32.0–36.0)
MCV: 93.5 fL (ref 80.0–100.0)
PLATELETS: 180 10*3/uL (ref 150–440)
RBC: 2.87 MIL/uL — ABNORMAL LOW (ref 3.80–5.20)
RDW: 14.2 % (ref 11.5–14.5)
WBC: 11.9 10*3/uL — AB (ref 3.6–11.0)

## 2018-06-13 MED ORDER — IBUPROFEN 600 MG PO TABS
600.0000 mg | ORAL_TABLET | Freq: Four times a day (QID) | ORAL | Status: DC
  Administered 2018-06-13 – 2018-06-14 (×4): 600 mg via ORAL
  Filled 2018-06-13 (×4): qty 1

## 2018-06-13 NOTE — Lactation Note (Addendum)
This note was copied from a baby's chart. Lactation Consultation Note  Patient Name: Girl Neila GearSendy Merlo Sanchez [id number]'s Date: 06/13/2018 Reason for consult: Initial assessment;1st time breastfeeding;Infant < 6lbs;Term Mom questioning if Al DecantJuliana is getting enough milk from the breast.  Demonstrated hand expression of copious amounts of colostrum which was given via TB syringe after breast feeding.  Discouraged supplementation of formula with a bottle too early explaining supply and demand and need to breast feed frequently to bring in mature milk and ensure a plentiful milk supply through interpreter.  Reviewed normal course of lactation and routine newborn feeding patterns.  Lactation name and number written on white board and encouraged to call for questions, concerns or assistance.    Maternal Data Formula Feeding for Exclusion: No Has patient been taught Hand Expression?: Yes(Can easily hand express lots of colostrum) Does the patient have breastfeeding experience prior to this delivery?: No(First one would not latch per mom)  Feeding Feeding Type: Breast Fed Length of feed: 10 min  LATCH Score Latch: Repeated attempts needed to sustain latch, nipple held in mouth throughout feeding, stimulation needed to elicit sucking reflex.  Audible Swallowing: A few with stimulation  Type of Nipple: Everted at rest and after stimulation  Comfort (Breast/Nipple): Soft / non-tender  Hold (Positioning): Assistance needed to correctly position infant at breast and maintain latch.  LATCH Score: 7  Interventions Interventions: Breast feeding basics reviewed;Assisted with latch;Breast compression;Coconut oil;Skin to skin;Adjust position;Breast massage;Support pillows;Hand express;Position options  Lactation Tools Discussed/Used Tools: Coconut oil;Other (comment)(Hand expressed colostrum given via TB syringe) WIC Program: Yes   Consult Status Consult Status: PRN    Louis MeckelWilliams, Dmya Long  Kay 06/13/2018, 8:53 PM

## 2018-06-13 NOTE — Progress Notes (Signed)
Post Partum Day 1 Subjective: Doing well, no complaints.  Tolerating regular diet, pain with PO meds, voiding and ambulating without difficulty.  No CP SOB F/C N/V or leg pain    Objective: BP 100/63 (BP Location: Left Arm)   Pulse 78   Temp 98.7 F (37.1 C) (Oral)   Resp 16   Ht 5' 2.5" (1.588 m)   Wt 56.7 kg (125 lb)   LMP 09/12/2017   SpO2 100%   Breastfeeding? Unknown   BMI 22.50 kg/m    Physical Exam:  General: NAD CV: RRR Pulm: nl effort, CTABL Lochia: moderate Uterine Fundus: fundus firm and below umbilicus DVT Evaluation: no cords, ttp LEs   Recent Labs    06/12/18 1700 06/13/18 0459  HGB 10.8* 9.3*  HCT 31.4* 26.8*  WBC 11.2* 11.9*  PLT 206 180    Assessment/Plan: 26 y.o. G2P1002 postpartum day # 1  1. Doing well postpartum continue routine cares 2. Breastfeeding: lactation support/teaching 3. Anemia: continue daily PO iron   Anticipate discharge tomorrow  ----- Ranae Plumberhelsea Kristi Hyer, MD Attending Obstetrician and Gynecologist [facility]

## 2018-06-13 NOTE — Plan of Care (Signed)
Vs stable; up ad lib; taking PO motrin for pain control; able to have the IV converted to SL this shift; will need interpreter to communicate to pt and her significant other

## 2018-06-14 LAB — RPR: RPR: NONREACTIVE

## 2018-06-14 MED ORDER — IBUPROFEN 600 MG PO TABS
600.0000 mg | ORAL_TABLET | Freq: Four times a day (QID) | ORAL | Status: DC
  Administered 2018-06-14: 600 mg via ORAL
  Filled 2018-06-14: qty 1

## 2018-06-14 NOTE — Discharge Instructions (Signed)

## 2018-06-14 NOTE — Progress Notes (Signed)
Pt discharged with infant.  Discharge instructions, prescriptions and follow up appointment given to and reviewed with pt. Pt verbalized understanding. Escorted out by auxillary. 

## 2018-06-14 NOTE — Plan of Care (Signed)
Vs stable; up ad lib; taking motrin for pain control; use interpreter to communicate with pt and her family; breastfeeding well; probable discharge today

## 2018-06-15 ENCOUNTER — Inpatient Hospital Stay: Admission: RE | Admit: 2018-06-15 | Payer: Self-pay | Source: Ambulatory Visit

## 2018-06-21 IMAGING — US US MFM OB COMP +14 WKS
2 series · 12 of 28 positions shown · non-contrast
Comparison: none

PATIENT INFO:

PERFORMED BY:
SERVICE(S) PROVIDED:
INDICATIONS:
16 weeks gestation of pregnancy
Family hx of congenital heart defect
FETAL EVALUATION:
Num Of Fetuses:     1
Fetal Heart         153
Rate(bpm):
Presentation:       Variable
Placenta:           Low-lying posterior placenta
BIOMETRY:
BPD:      35.4  mm     G. Age:  16w 6d         70  %    CI:         78.7   %    70 - 86
FL/HC:      16.5   %    13.3 -
HC:      126.2  mm     G. Age:  16w 3d         35  %
FL/BPD:     58.8   %
FL:       20.8  mm     G. Age:  16w 2d         37  %
HUM:      20.5  mm     G. Age:  16w 1d         46  %
CER:      13.9  mm     G. Age:  N/A           < 5  %
CM:        1.7  mm
GESTATIONAL AGE:
LMP:           16w 3d        Date:  09/15/17                 EDD:   06/22/18
U/S Today:     16w 4d                                        EDD:   06/21/18
Best:          16w 3d     Det. By:  LMP  (09/15/17)          EDD:   06/22/18
ANATOMY:
Cranium:               Normal appearance      Aortic Arch:            Not visualized
Cavum:                 CSP visualized         Ductal Arch:            Not visualized
Ventricles:            Normal appearance      Diaphragm:              Within Normal Limits
Choroid Plexus:        Within Normal Limits   Stomach:                Seen
Cerebellum:            Within Normal Limits   Abdomen:                Within Normal
Limits
Posterior Fossa:       Within Normal Limits   Abdominal Wall:         Normal appearance
Nuchal Fold:           Within Normal Limits   Cord Vessels:           3 vessels
Face:                  Orbits visualized      Kidneys:                Normal appearance
Lips:                  Normal appearance      Bladder:                Seen
Thoracic:              Within Normal Limits   Spine:                  Normal appearance
Heart:                 Normal 4 chamber       Upper Extremities:      Visualized
and outflow tracts
RVOT:                  Normal appearance      Lower Extremities:      Visualized
LVOT:                  Normal appearance
CERVIX UTERUS ADNEXA:
Cervix
Length:            4.1  cm.

[Series 1: us mfm ob comp +14 wks · 0.25mm/px · 11 of 103 slices shown (1 of 2)]
[im 5/103]
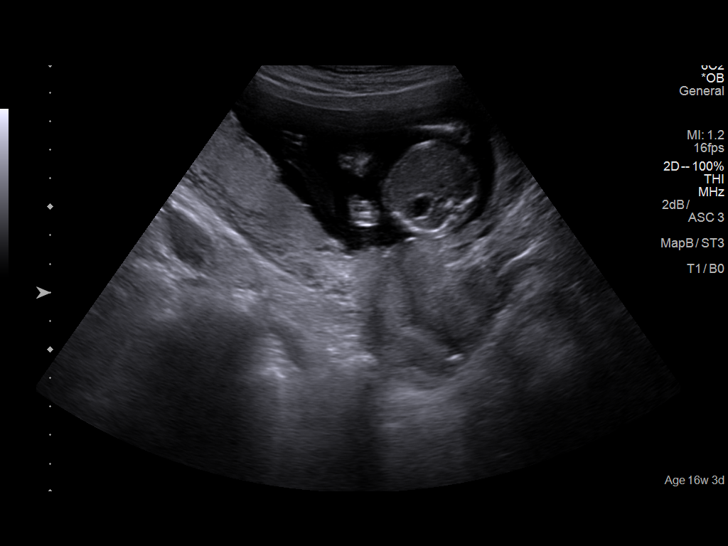
[im 13/103]
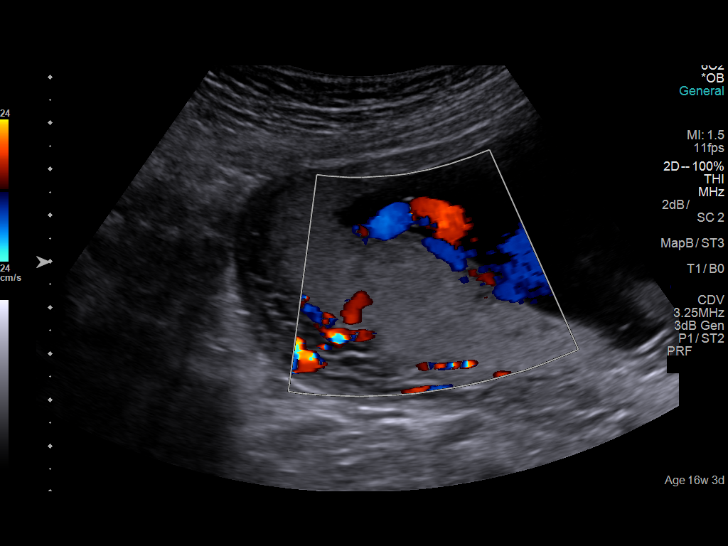
[im 22/103]
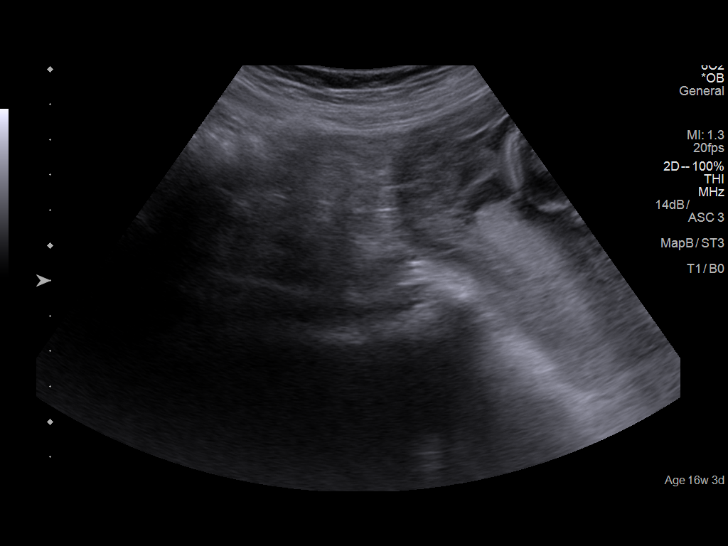
[im 35/103]
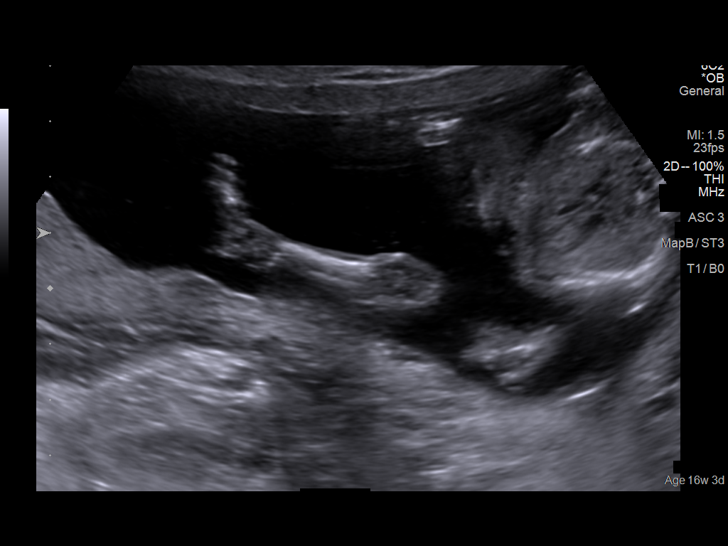
[im 43/103]
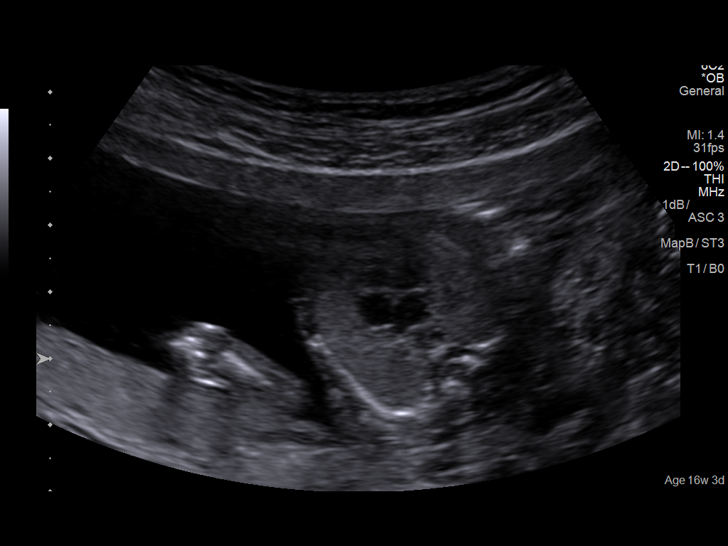
[im 52/103]
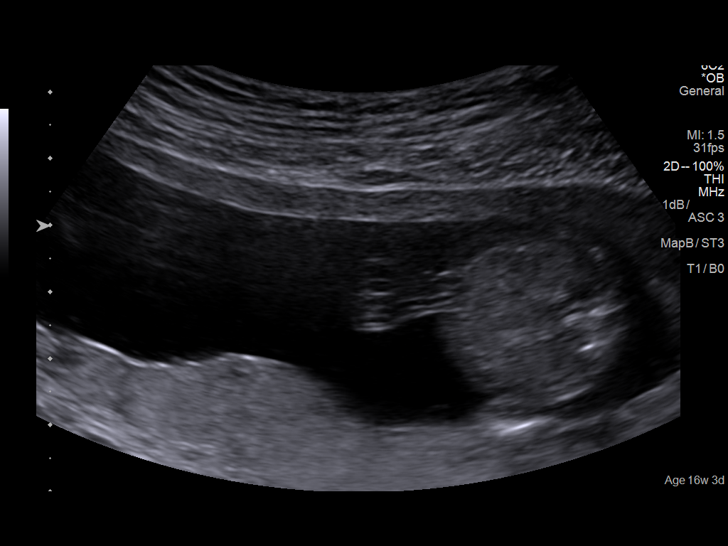
[im 64/103]
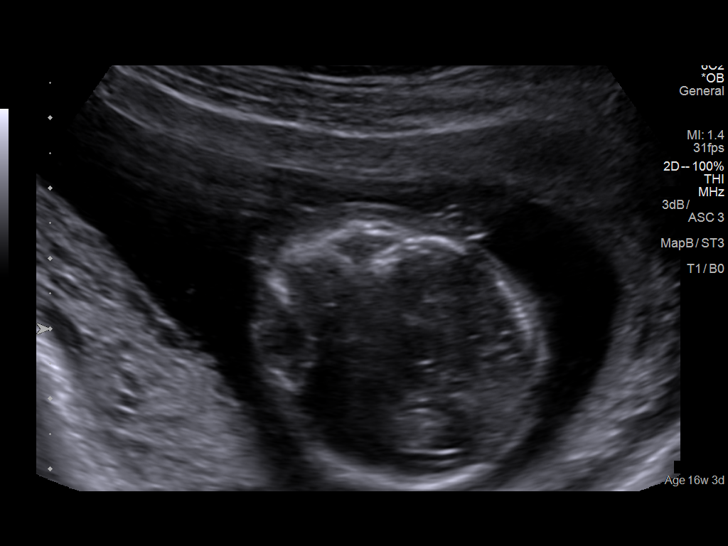
[im 73/103]
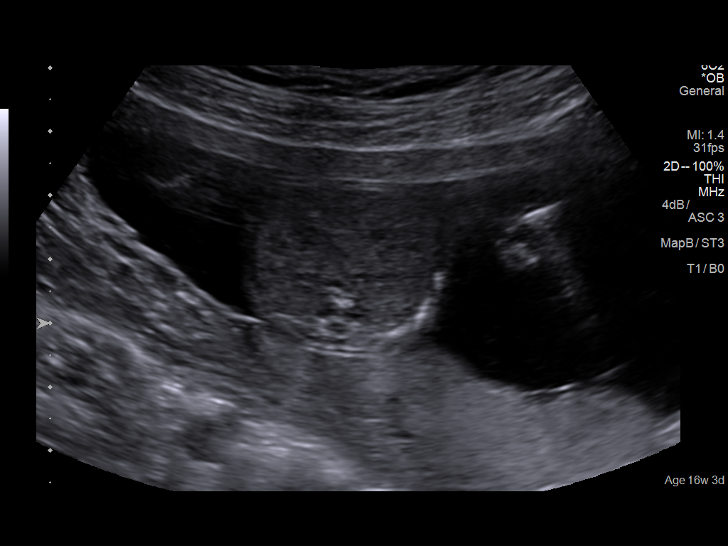
[im 81/103]
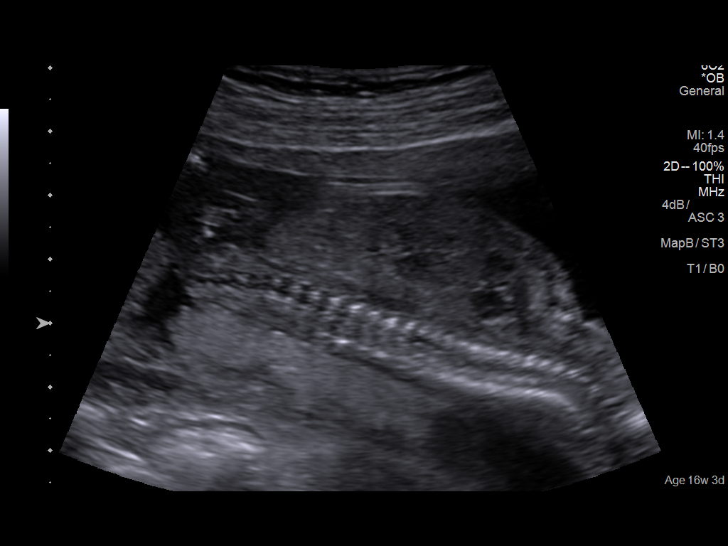
[im 94/103]
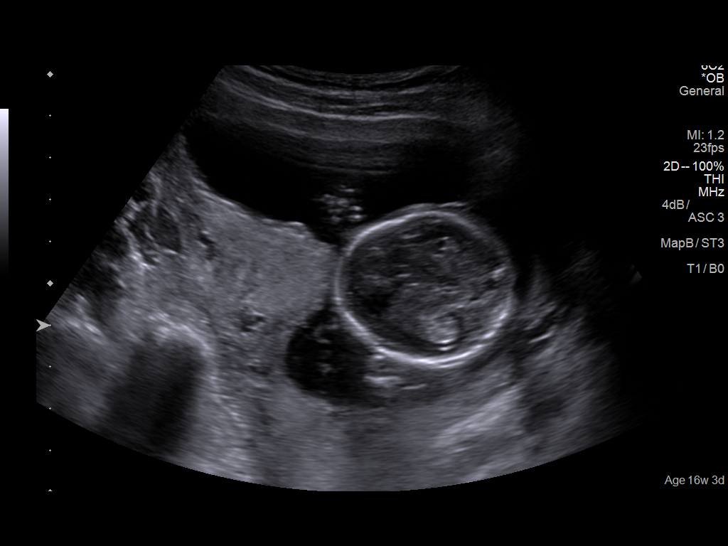
[im 103/103]
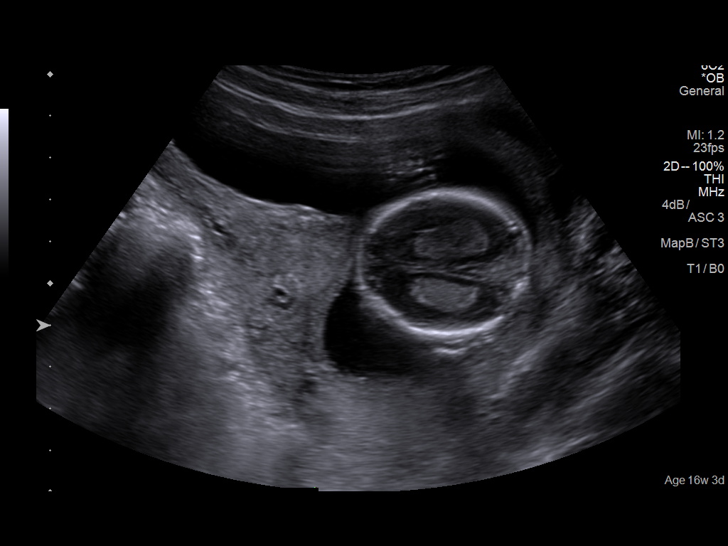

[Series 1001: us mfm ob comp +14 wks · 0.21mm/px · 1 of 14 slices shown (2 of 2)]
[im 7/14]
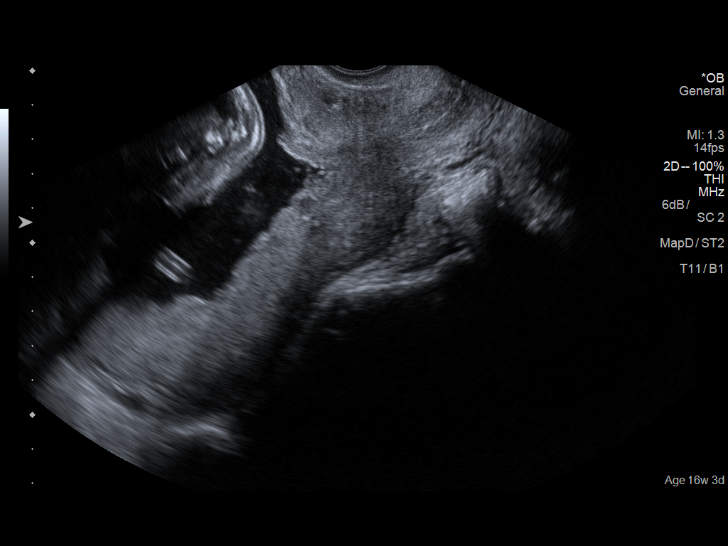

[12 of 28 positions shown; findings below may reference images not displayed]

IMPRESSION: Dear Ms. GERHARDT,

Thank you for referring your patient [facility] for a fetal anatomical survey.  The patient was
originally referred to [facility] for first trimester
screening on 12/15/2017 which was normal.

There is a singleton gestation at 95w4d gestation by LMP
consistent with US performed here at 80w3d on 12/15/2017.

The fetal biometry correlates with established dating.

Detailed evaluation of the fetal anatomy was performed.  The
fetal anatomical survey appears within normal limits within
the resolution of ultrasound.

There is a low-lying placenta, confirmed by endovaginal US,
measuring within 1.2 to 1.5 cm of the cervical os.  The patient
was counseled about the findings and the implications.
RECOMMENDATIONS:

The patient was scheduled for a follow-up US in 12 weeks to
assess placental location (and growth).

Thank you for allowing us to participate in your patient's care.

assistance.
